# Patient Record
Sex: Male | Born: 2000 | Hispanic: No | Marital: Single | Smoking: Never smoker
Health system: Southern US, Community
[De-identification: ages and names within clinical notes are randomized; demographics above are authoritative.]

## PROBLEM LIST (undated history)

## (undated) DIAGNOSIS — E785 Hyperlipidemia, unspecified: Secondary | ICD-10-CM

## (undated) DIAGNOSIS — R079 Chest pain, unspecified: Secondary | ICD-10-CM

## (undated) DIAGNOSIS — B079 Viral wart, unspecified: Secondary | ICD-10-CM

## (undated) DIAGNOSIS — N529 Male erectile dysfunction, unspecified: Secondary | ICD-10-CM

## (undated) DIAGNOSIS — E782 Mixed hyperlipidemia: Secondary | ICD-10-CM

## (undated) DIAGNOSIS — K5900 Constipation, unspecified: Secondary | ICD-10-CM

## (undated) HISTORY — DX: Viral wart, unspecified: B07.9

## (undated) HISTORY — PX: NO PAST SURGERIES: SHX2092

## (undated) HISTORY — DX: Chest pain, unspecified: R07.9

## (undated) HISTORY — DX: Constipation, unspecified: K59.00

## (undated) HISTORY — DX: Mixed hyperlipidemia: E78.2

## (undated) HISTORY — DX: Hyperlipidemia, unspecified: E78.5

## (undated) HISTORY — DX: Male erectile dysfunction, unspecified: N52.9

---
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
## 2021-02-17 DIAGNOSIS — K5900 Constipation, unspecified: Secondary | ICD-10-CM | POA: Insufficient documentation

## 2021-02-17 DIAGNOSIS — N529 Male erectile dysfunction, unspecified: Secondary | ICD-10-CM | POA: Insufficient documentation

## 2021-02-17 DIAGNOSIS — E785 Hyperlipidemia, unspecified: Secondary | ICD-10-CM | POA: Insufficient documentation

## 2021-02-17 DIAGNOSIS — R079 Chest pain, unspecified: Secondary | ICD-10-CM | POA: Insufficient documentation

## 2021-02-17 DIAGNOSIS — B079 Viral wart, unspecified: Secondary | ICD-10-CM | POA: Insufficient documentation

## 2021-02-17 DIAGNOSIS — E782 Mixed hyperlipidemia: Secondary | ICD-10-CM | POA: Insufficient documentation

## 2021-02-22 ENCOUNTER — Encounter: Payer: Self-pay | Admitting: Cardiology

## 2021-02-22 ENCOUNTER — Ambulatory Visit: Payer: 59 | Admitting: Cardiology

## 2021-02-22 ENCOUNTER — Other Ambulatory Visit: Payer: Self-pay

## 2021-02-22 VITALS — BP 112/66 | HR 65 | Ht 74.0 in | Wt 246.0 lb

## 2021-02-22 DIAGNOSIS — N528 Other male erectile dysfunction: Secondary | ICD-10-CM

## 2021-02-22 DIAGNOSIS — E782 Mixed hyperlipidemia: Secondary | ICD-10-CM

## 2021-02-22 DIAGNOSIS — R079 Chest pain, unspecified: Secondary | ICD-10-CM | POA: Diagnosis not present

## 2021-02-22 NOTE — Patient Instructions (Signed)
Medication Instructions:  No medication changes. *If you need a refill on your cardiac medications before your next appointment, please call your pharmacy*   Lab Work: None ordered If you have labs (blood work) drawn today and your tests are completely normal, you will receive your results only by: MyChart Message (if you have MyChart) OR A paper copy in the mail If you have any lab test that is abnormal or we need to change your treatment, we will call you to review the results.   Testing/Procedures:  We will order CT coronary calcium score. It will cost $99.00 and is not covered by insurance.  Please call [phone] to schedule.   [facility]  [address]  [place], [place] [address]       Stress Echocardiogram Information Sheet                                                      Instructions:    1. You may take your morning medications the morning of the test  2. Light breakfast no caffeine  3. Dress prepared to exercise.  4. DO NOT use ANY caffeine or tobacco products 3 hours before appointment.  5. Please bring all current prescription medications.  Follow-Up: At [facility], you and your health needs are our priority.  As part of our continuing mission to provide you with exceptional heart care, we have created designated Provider Care Teams.  These Care Teams include your primary Cardiologist (physician) and Advanced Practice Providers (APPs -  Physician Assistants and Nurse Practitioners) who all work together to provide you with the care you need, when you need it.  We recommend signing up for the patient portal called "MyChart".  Sign up information is provided on this After Visit Summary.  MyChart is used to connect with patients for Virtual Visits (Telemedicine).  Patients are able to view lab/test results, encounter notes, upcoming appointments, etc.  Non-urgent messages can be sent to your provider as well.   To learn more about what you can  do with MyChart, go to [email or web address].    Your next appointment:   3 month(s)  The format for your next appointment:   In Person  Provider:   Belva Crome, MD   Other Instructions  Coronary Calcium Scan A coronary calcium scan is an imaging test used to look for deposits of plaque in the inner lining of the blood vessels of the heart (coronary arteries). Plaque is made up of calcium, protein, and fatty substances. These deposits of plaque can partly clog and narrow the coronary arteries without producing any symptoms or warning signs. This puts a person at risk for a heart attack. This test is recommended for people who are at moderate risk for heart disease. The test can find plaque deposits before symptoms develop. Tell a health care provider about: Any allergies you have. All medicines you are taking, including vitamins, herbs, eye drops, creams, and over-the-counter medicines. Any problems you or family members have had with anesthetic medicines. Any blood disorders you have. Any surgeries you have had. Any medical conditions you have. Whether you are pregnant or may be pregnant. What are the risks? Generally, this is a safe procedure. However, problems may occur, including: Harm to a pregnant woman and her unborn baby. This test involves the  use of radiation. Radiation exposure can be dangerous to a pregnant woman and her unborn baby. If you are pregnant or think you may be pregnant, you should not have this procedure done. Slight increase in the risk of cancer. This is because of the radiation involved in the test. What happens before the procedure? Ask your health care provider for any specific instructions on how to prepare for this procedure. You may be asked to avoid products that contain caffeine, tobacco, or nicotine for 4 hours before the procedure. What happens during the procedure? You will undress and remove any jewelry from your neck or chest. You will put  on a hospital gown. Sticky electrodes will be placed on your chest. The electrodes will be connected to an electrocardiogram (ECG) machine to record a tracing of the electrical activity of your heart. You will lie down on a curved bed that is attached to the CT scanner. You may be given medicine to slow down your heart rate so that clear pictures can be created. You will be moved into the CT scanner, and the CT scanner will take pictures of your heart. During this time, you will be asked to lie still and hold your breath for 2-3 seconds at a time while each picture of your heart is being taken. The procedure may vary among health care providers and hospitals.    What happens after the procedure? You can get dressed. You can return to your normal activities. It is up to you to get the results of your procedure. Ask your health care provider, or the department that is doing the procedure, when your results will be ready. Summary A coronary calcium scan is an imaging test used to look for deposits of plaque in the inner lining of the blood vessels of the heart (coronary arteries). Plaque is made up of calcium, protein, and fatty substances. Generally, this is a safe procedure. Tell your health care provider if you are pregnant or may be pregnant. Ask your health care provider for any specific instructions on how to prepare for this procedure. A CT scanner will take pictures of your heart. You can return to your normal activities after the scan is done. This information is not intended to replace advice given to you by your health care provider. Make sure you discuss any questions you have with your health care provider. Document Revised: 10/16/2018 Document Reviewed: 10/16/2018 Elsevier Patient Education  2021 Elsevier Inc.   Exercise Stress Echocardiogram An exercise stress echocardiogram is a test to check how well your heart is working. This test uses sound waves and a computer to make  pictures of your heart. These pictures will be taken before and after you exercise. For this test, you will walk on a treadmill or ride a bicycle to make your heart beat faster. While you exercise, your heart will be checked with an electrocardiogram (ECG). Your blood pressure will also be checked. You may have this test if: You have chest pain or a heart problem. You had a heart attack or heart surgery not long ago. You have heart valve problems. You have a condition that causes narrowing of the blood vessels that supply your heart. You have a high risk of heart disease and: You are starting a new exercise program. You need to have a big surgery. Tell a doctor about: Any allergies you have. All medicines you are taking. This includes vitamins, herbs, eye drops, creams, and over-the-counter medicines. Any problems you or family  members have had with medicines that make you fall asleep (anesthetic medicines). Any surgeries you have had. Any blood disorders you have. Any medical conditions you have. Whether you are pregnant or may be pregnant. What are the risks? Generally, this is a safe test. However, problems may occur, including: Chest pain. Feeling dizzy or light-headed. Shortness of breath. Increased or irregular heartbeat. Feeling like you may vomit (nausea) or vomiting. Heart attack. This is very rare. What happens before the test? Medicines Ask your doctor about changing or stopping your normal medicines. This is important if you take diabetes medicines or blood thinners. If you use an inhaler, bring it to the test. General instructions Wear comfortable clothes and walking shoes. Follow instructions from your doctor about what you cannot eat or drink before the test. Do not drink or eat anything that has caffeine in it. Stop having caffeine 24 hours before the test. Do not smoke or use products that contain nicotine or tobacco for 4 hours before the test. If you need help  quitting, ask your doctor. What happens during the test?  You will take off your clothes from the waist up and put on a hospital gown. Electrodes or patches will be put on your chest. A blood pressure cuff will be put on your arm. Before you exercise, a computer will make a picture of your heart. To do this: You will lie down and a gel will be put on your chest. A wand will be moved over the gel. Sound waves from the wand will go to the computer to make the picture. Then, you will start to exercise. You may walk on a treadmill or pedal a bicycle. Your blood pressure and heart rhythm will be checked while you exercise. The exercise will get harder or faster. You will exercise until: Your heart reaches a certain level. You are too tired to go on. You cannot go on because of chest pain, weakness, or dizziness. You will lie down right away so another picture of your heart can be taken. The procedure may vary among doctors and hospitals. What can I expect after the test? After your test, it is common to have: Mild soreness. Mild tiredness. Your heart rate and blood pressure will be checked until they return to your normal levels. You should not have any new symptoms after this test. Follow these instructions at home: If your doctor says that you can, you may: Eat what you normally eat. Do your normal activities. Take over-the-counter and prescription medicines only as told by your doctor. Keep all follow-up visits. It is up to you to get the results of your test. Ask how to get your results when they are ready. Contact a doctor if: You feel dizzy or light-headed. You have a fast or irregular heartbeat. You feel like you may vomit or you vomit. You have a headache. You feel short of breath. Get help right away if: You develop pain or pressure: In your chest. In your jaw or neck. Between your shoulders. That goes down your left arm. You faint. You have trouble breathing. These  symptoms may be an emergency. Get medical help right away. Call your local emergency services (911 in the [place] Do not wait to see if the symptoms will go away. Do not drive yourself to the hospital. Summary This is a test that checks how well your heart is working. Follow instructions about what you cannot eat or drink before the test. Ask your doctor if you  should take your normal medicines before the test. Stop having caffeine 24 hours before the test. Do not smoke or use products with nicotine or tobacco in them for 4 hours before the test. During the test, your blood pressure and heart rhythm will be checked while you exercise. This information is not intended to replace advice given to you by your health care provider. Make sure you discuss any questions you have with your health care provider. Document Revised: 12/09/2020 Document Reviewed: 11/19/2019 Elsevier Patient Education  2022 [employer].

## 2021-02-22 NOTE — Progress Notes (Signed)
Cardiology Office Note:    Date:  02/22/2021   ID:  Louis Tucker, DOB 2001-02-11, MRN [id number]  PCP:  Eunice Blase, PA-C  Cardiologist:  Garwin Brothers, MD   Referring MD: Eunice Blase, PA-C    ASSESSMENT:    1. Mixed hyperlipidemia   2. Chest pain, unspecified type   3. Other male erectile dysfunction    PLAN:    In order of problems listed above:  Primary prevention stressed with the patient.  Importance of compliance with diet medication stressed any vocalized understanding.  He was advised to walk at least half an hour every day or 2 slow jogs and he is agreeable.  I told him to do this after be done with the stress test. Chest pain: Atypical in etiology.  He wants to evaluated as he is concerned about it.  We will do an exercise stress echo.  If it is negative he was told to embark on an exercise program and he agrees. Mixed dyslipidemia: Appears to be familial in nature and I discussed that with him and he would be best referred to a dietitian with such degree of elevated lipids and he is agreeable. Coronary risk stratification: We will set him up for a calcium score.  This is in view of his risk factors and his chest pain.  He is agreeable. Erectile dysfunction: As mentioned below from an accident during sexual intercourse.  I told him the value of intervention such as regular aerobic exercise and he agrees.  I told him to also seek the help of urologist or any other specialist from referral institutions or tertiary institutions like [facility] or [facility] and he is going to think about it. Patient will be seen in follow-up appointment in 6 months or earlier if the patient has any concerns    Medication Adjustments/Labs and Tests Ordered: Current medicines are reviewed at length with the patient today.  Concerns regarding medicines are outlined above.  Orders Placed This Encounter  Procedures   CT CARDIAC SCORING   Amb ref to Medical Nutrition Therapy-MNT   EKG 12-Lead    ECHOCARDIOGRAM STRESS TEST    No orders of the defined types were placed in this encounter.    History of Present Illness:    Louis Tucker is a 20 y.o. male who is being seen today for the evaluation of chest pain at the request of O'Buch, Greta, PA-C.  Patient is a pleasant 20 year old male.  He is accompanied by his mother.  He has history of erectile dysfunction.  He apparently says he had an accident during sexual intercourse and subsequently has had problems with this.  He is to use sildenafil but then had chest pain issues so he stopped taking it.  He is here for evaluation of chest pain.  No orthopnea or PND.  His chest pain is atypical in nature.  Stabbing and sensations happen at times not related clearly to exertion.  He is concerned about it.  At the time of my evaluation, the patient is alert awake oriented and in no distress.  Past Medical History:  Diagnosis Date   Chest pain    Constipation    Erectile dysfunction    Erectile dysfunction of non-organic origin 05/11/2018   Hyperlipidemia    Mixed hyperlipidemia    Verruca     Past Surgical History:  Procedure Laterality Date   NO PAST SURGERIES      Current Medications: Current Meds  Medication Sig   rosuvastatin (CRESTOR)  20 MG tablet Take 20 mg by mouth daily.     Allergies:   Amoxicillin   Social History   Socioeconomic History   Marital status: Single    Spouse name: Not on file   Number of children: Not on file   Years of education: Not on file   Highest education level: Not on file  Occupational History   Not on file  Tobacco Use   Smoking status: Never   Smokeless tobacco: Never  Substance and Sexual Activity   Alcohol use: Not on file   Drug use: Not on file   Sexual activity: Not on file  Other Topics Concern   Not on file  Social History Narrative   Not on file   Social Determinants of Health   Financial Resource Strain: Not on file  Food Insecurity: Not on file   Transportation Needs: Not on file  Physical Activity: Not on file  Stress: Not on file  Social Connections: Not on file     Family History: The patient's family history includes Cancer in his maternal great-grandfather and maternal great-grandmother; Diabetes in his maternal grandfather, maternal grandmother, and maternal great-grandmother; Hypertension in his maternal grandfather, maternal grandmother, and paternal grandmother. There is no history of Heart disease.  ROS:   Please see the history of present illness.    All other systems reviewed and are negative.  EKGs/Labs/Other Studies Reviewed:    The following studies were reviewed today: EKG reveals sinus rhythm and nonspecific ST-T changes   Recent Labs: No results found for requested labs within last 8760 hours.  Recent Lipid Panel No results found for: CHOL, TRIG, HDL, CHOLHDL, VLDL, LDLCALC, LDLDIRECT  Physical Exam:    VS:  BP 112/66   Pulse 65   Ht 6\' 2"  (1.88 m)   Wt 246 lb 0.6 oz (111.6 kg)   SpO2 98%   BMI 31.59 kg/m     Wt Readings from Last 3 Encounters:  02/22/21 246 lb 0.6 oz (111.6 kg)     GEN: Patient is in no acute distress HEENT: Normal NECK: No JVD; No carotid bruits LYMPHATICS: No lymphadenopathy CARDIAC: S1 S2 regular, 2/6 systolic murmur at the apex. RESPIRATORY:  Clear to auscultation without rales, wheezing or rhonchi  ABDOMEN: Soft, non-tender, non-distended MUSCULOSKELETAL:  No edema; No deformity  SKIN: Warm and dry NEUROLOGIC:  Alert and oriented x 3 PSYCHIATRIC:  Normal affect    Signed, 02/24/21, MD  02/22/2021 9:19 AM    [facility]

## 2021-03-10 ENCOUNTER — Telehealth (HOSPITAL_COMMUNITY): Payer: Self-pay | Admitting: *Deleted

## 2021-03-10 NOTE — Telephone Encounter (Signed)
Patient given instructions for stress echo. Told to arrive by 1:30.Katrinka Blazing, Suan Halter

## 2021-03-17 ENCOUNTER — Ambulatory Visit (HOSPITAL_COMMUNITY): Payer: 59

## 2021-03-17 ENCOUNTER — Ambulatory Visit (HOSPITAL_COMMUNITY): Payer: 59 | Attending: Cardiology

## 2021-03-19 ENCOUNTER — Ambulatory Visit (INDEPENDENT_AMBULATORY_CARE_PROVIDER_SITE_OTHER)
Admission: RE | Admit: 2021-03-19 | Discharge: 2021-03-19 | Disposition: A | Discharge status: Home / Self Care | Payer: Self-pay | Source: Ambulatory Visit | Attending: Cardiology | Admitting: Cardiology

## 2021-03-19 DIAGNOSIS — E782 Mixed hyperlipidemia: Secondary | ICD-10-CM

## 2021-04-28 ENCOUNTER — Telehealth (HOSPITAL_COMMUNITY): Payer: Self-pay | Admitting: *Deleted

## 2021-04-28 ENCOUNTER — Encounter: Payer: Managed Care, Other (non HMO) | Attending: Cardiology | Admitting: Dietician

## 2021-04-28 NOTE — Telephone Encounter (Signed)
Patient given detailed instructions per Stress Test Requisition Sheet for test on 05/03/21 at 2:00.Patient Notified to arrive 30 minutes early, and that it is imperative to arrive on time for appointment to keep from having the test rescheduled.  Patient verbalized understanding. Veronia Beets

## 2021-05-03 ENCOUNTER — Ambulatory Visit (HOSPITAL_COMMUNITY): Payer: Managed Care, Other (non HMO)

## 2021-05-24 ENCOUNTER — Telehealth (HOSPITAL_COMMUNITY): Payer: Self-pay | Admitting: *Deleted

## 2021-05-24 NOTE — Telephone Encounter (Signed)
Left message on voicemail per DPR in reference to upcoming appointment scheduled on 05/31/21 at 2:00 with detailed instructions given per Myocardial Perfusion Study Information Sheet for the test. LM to arrive 15 minutes early, and that it is imperative to arrive on time for appointment to keep from having the test rescheduled. If you need to cancel or reschedule your appointment, please call the office within 24 hours of your appointment. Failure to do so may result in a cancellation of your appointment, and a $50 no show fee. Phone number given for call back for any questions.

## 2021-05-25 ENCOUNTER — Other Ambulatory Visit (HOSPITAL_COMMUNITY): Payer: Managed Care, Other (non HMO)

## 2021-05-31 ENCOUNTER — Other Ambulatory Visit: Payer: Self-pay

## 2021-05-31 ENCOUNTER — Ambulatory Visit (HOSPITAL_COMMUNITY): Payer: Managed Care, Other (non HMO)

## 2021-05-31 ENCOUNTER — Ambulatory Visit (HOSPITAL_COMMUNITY): Payer: Managed Care, Other (non HMO) | Attending: Cardiology

## 2021-05-31 DIAGNOSIS — R079 Chest pain, unspecified: Secondary | ICD-10-CM | POA: Diagnosis not present

## 2021-05-31 LAB — ECHOCARDIOGRAM STRESS TEST
Area-P 1/2: 3.56 cm2
S' Lateral: 3 cm

## 2021-05-31 MED ORDER — PERFLUTREN LIPID MICROSPHERE
1.0000 mL | INTRAVENOUS | Status: AC | PRN
  Administered 2021-05-31 (×2): 2 mL via INTRAVENOUS
  Administered 2021-05-31: 1 mL via INTRAVENOUS
  Administered 2021-05-31: 2 mL via INTRAVENOUS

## 2021-06-01 ENCOUNTER — Telehealth: Payer: Self-pay

## 2021-06-01 NOTE — Telephone Encounter (Signed)
-----   Message from Garwin Brothers, MD sent at 06/01/2021 12:24 PM EST ----- The results of the study is unremarkable. Please inform patient. I will discuss in detail at next appointment. Cc  primary care/referring physician Garwin Brothers, MD 06/01/2021 12:24 PM

## 2021-06-01 NOTE — Telephone Encounter (Signed)
Left VM to call back 

## 2021-06-11 ENCOUNTER — Ambulatory Visit: Payer: 59 | Admitting: Cardiology

## 2021-08-02 ENCOUNTER — Other Ambulatory Visit: Payer: Self-pay

## 2021-08-03 ENCOUNTER — Ambulatory Visit: Payer: 59 | Admitting: Cardiology

## 2021-09-28 ENCOUNTER — Ambulatory Visit: Payer: 59 | Admitting: Cardiology

## 2022-02-01 ENCOUNTER — Telehealth: Payer: Self-pay | Admitting: Cardiology

## 2022-02-01 NOTE — Telephone Encounter (Signed)
Patient needs letter for [employer] stating that his heart is fine, and his test came back fine.

## 2022-02-01 NOTE — Telephone Encounter (Signed)
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 Please advise upon your return

## 2022-02-07 NOTE — Telephone Encounter (Signed)
Pt aware that not is ready and he request to come to the office to pick up.

## 2023-03-10 IMAGING — CT CT CARDIAC CORONARY ARTERY CALCIUM SCORE
3 series · 14 of 20 positions shown, 16 images · non-contrast
Comparison: None.
COMPARISON: None.

Addendum:
EXAM:
OVER-READ INTERPRETATION  CT CHEST

The following report is an over-read performed by radiologist Dr.
Manrrique Bolom [REDACTED] on 03/19/2021. This
over-read does not include interpretation of cardiac or coronary
anatomy or pathology. The coronary calcium score interpretation by
the cardiologist is attached.
CLINICAL DATA: Cardiovascular Disease Risk stratification
Coronary Calcium Score
TECHNIQUE: A gated, non-contrast computed tomography scan of the heart was
performed using 3mm slice thickness. Axial images were analyzed on a
dedicated workstation. Calcium scoring of the coronary arteries was
performed using the Agatston method.

[Series 2: cascseq 2.0 sa36 70% (id) · axial · 0.44mm/px · z∈[-272,-182]mm · 4 of 76 slices shown]
[im 16/76  vessel]
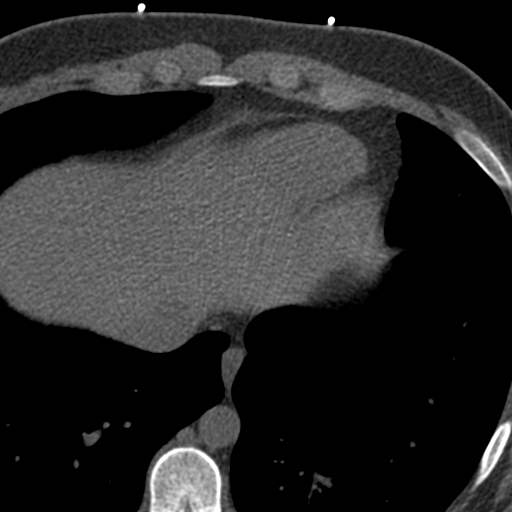
[im 31/76  vessel]
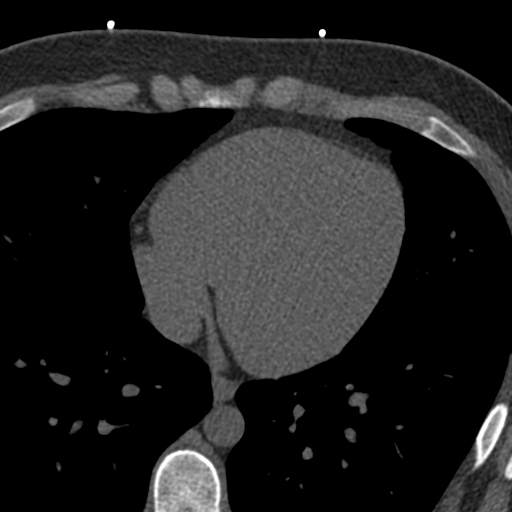
[im 46/76  vessel]
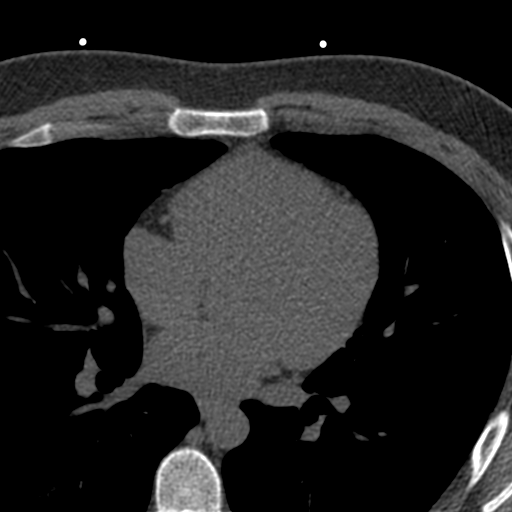
[im 61/76  vessel]
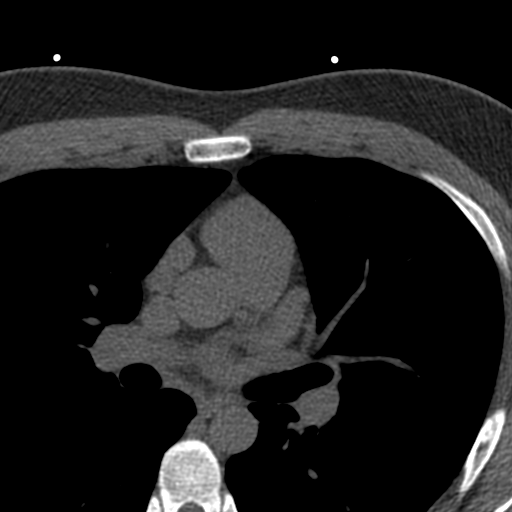

[Series 3: cascseq 2.0 bf37 st · axial · 0.76mm/px · z∈[-278,-178]mm · 5 of 76 slices shown, 7 images]
[im 13/76  vessel]
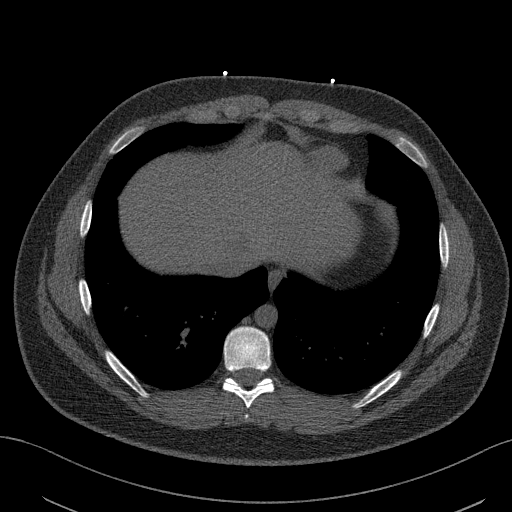
[im 13/76  lung]
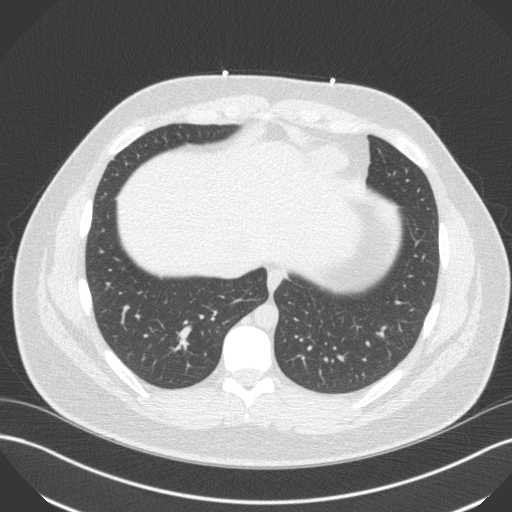
[im 26/76  vessel]
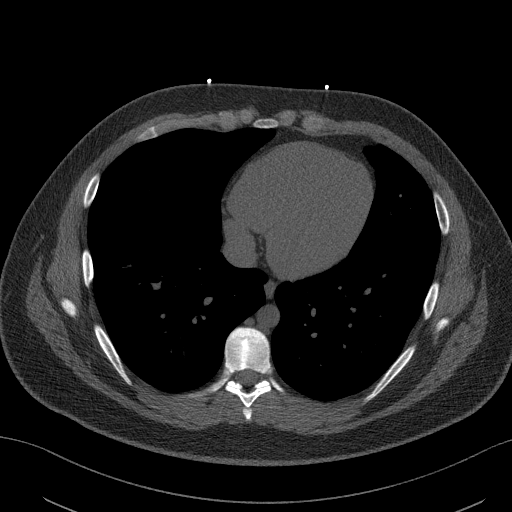
[im 38/76  vessel]
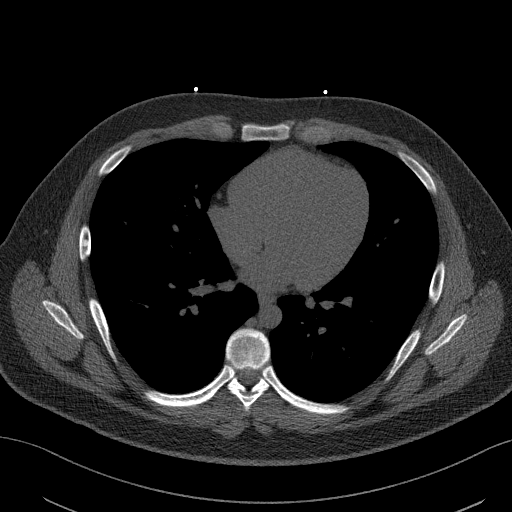
[im 51/76  vessel]
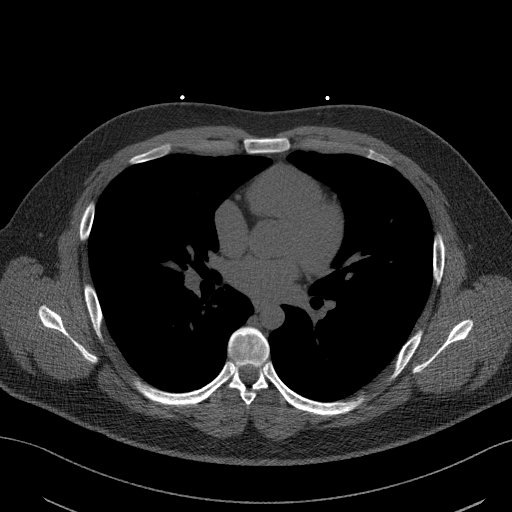
[im 63/76  vessel]
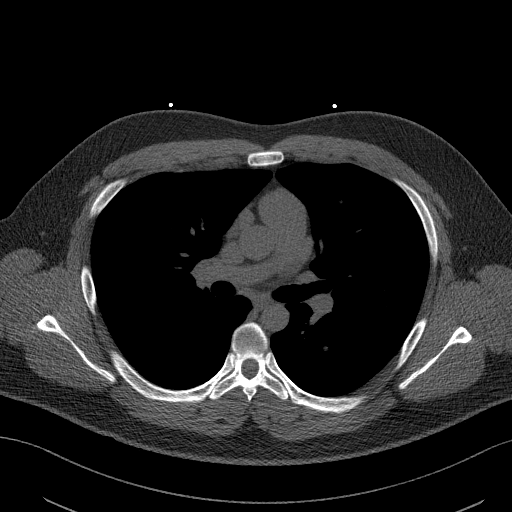
[im 63/76  lung]
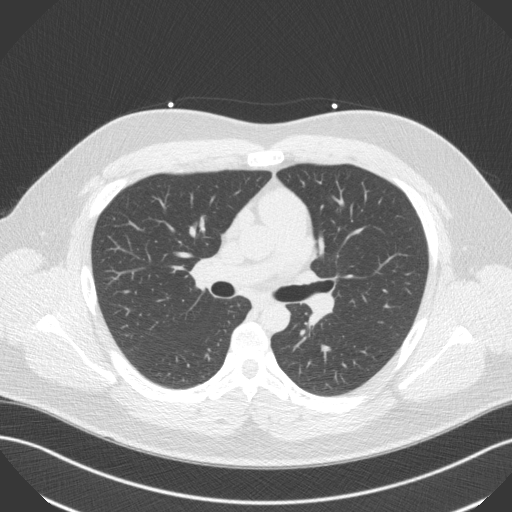

[Series 4: cascseq 2.0 br59 lung · axial · 0.76mm/px · z∈[-278,-178]mm · 5 of 76 slices shown]
[im 13/76  lung]
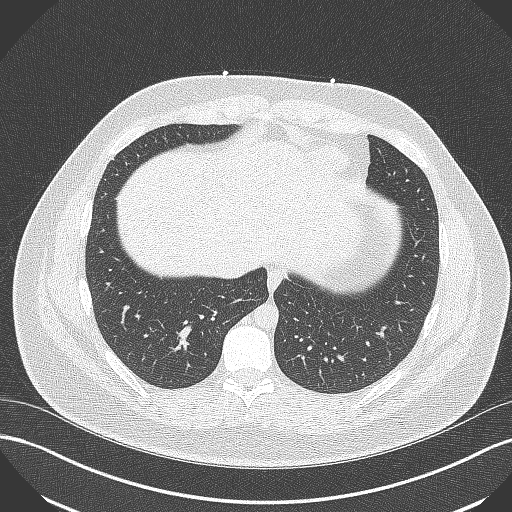
[im 26/76  lung]
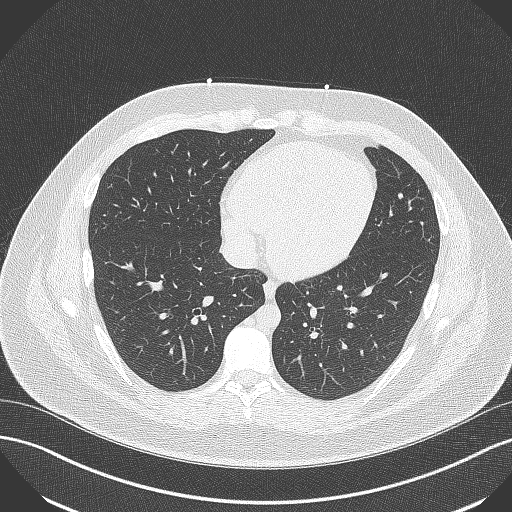
[im 38/76  lung]
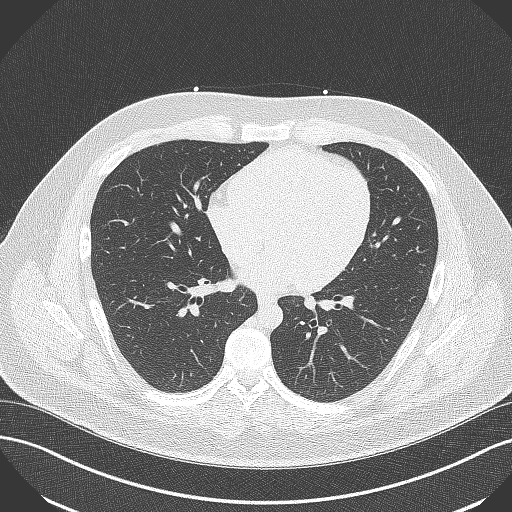
[im 51/76  lung]
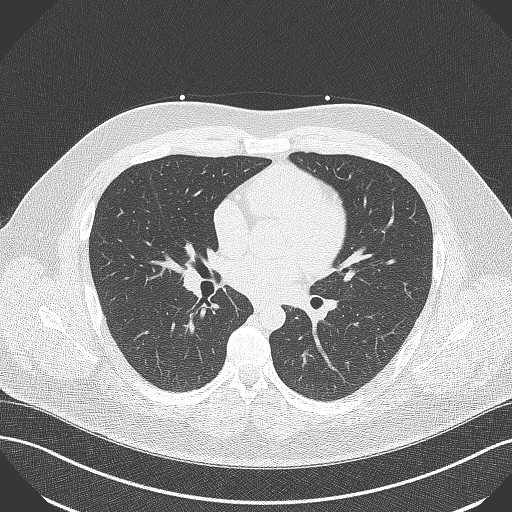
[im 63/76  lung]
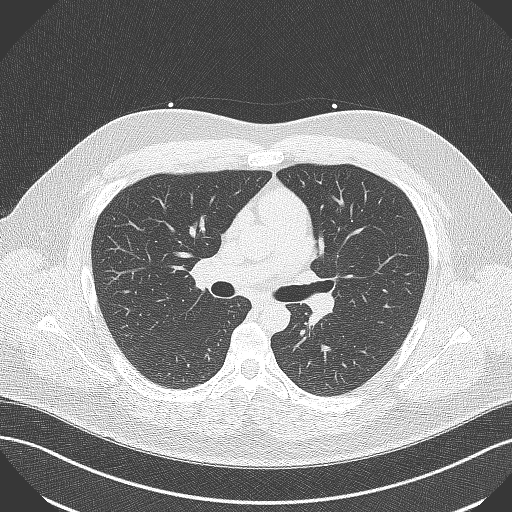

[14 of 20 positions shown; findings below may reference images not displayed]

FINDINGS: Within the visualized portions of the thorax there are no suspicious
appearing pulmonary nodules or masses, there is no acute
consolidative airspace disease, no pleural effusions, no
pneumothorax and no lymphadenopathy. Visualized portions of the
upper abdomen are unremarkable. There are no aggressive appearing
lytic or blastic lesions noted in the visualized portions of the
skeleton.
IMPRESSION: 1. No significant incidental noncardiac findings are noted.
FINDINGS: Coronary Calcium Score:

Left main: 0

Left anterior descending artery: 0

Left circumflex artery: 0

Right coronary artery: 0

Total: 0

Percentile: 0

Pericardium: Normal.

Ascending Aorta: Normal caliber.

Non-cardiac: See separate report from [REDACTED].
IMPRESSION: Coronary calcium score of 0. This was 0 percentile for age-, race-,
and sex-matched controls.



If CAC=0, it is reasonable to withhold statin therapy and reassess
in 5 to 10 years, as long as higher risk conditions are absent
(diabetes mellitus, family history of premature CHD in first degree
relatives (males <55 years; females <65 years), cigarette smoking,
or LDL >=190 mg/dL).

If CAC is 1 to 99, it is reasonable to initiate statin therapy for
patients >=55 years of age.

If CAC is >=100 or >=75th percentile, it is reasonable to initiate
statin therapy at any age.

Cardiology referral should be considered for patients with CAC
scores >=400 or >=75th percentile.

*4167 AHA/ACC/AACVPR/AAPA/ABC/BISSELL/RENE EDGAR/CHAI/Behan/CHAIRES/WILHITE/SUI
Guideline on the Management of Blood Cholesterol: A Report of the
American College of Cardiology/American Heart Association Task Force
on Clinical Practice Guidelines. J Am Coll Cardiol.
8839;73(24):5753-5733.

*** End of Addendum ***
EXAM:
OVER-READ INTERPRETATION  CT CHEST

The following report is an over-read performed by radiologist Dr.
Manrrique Bolom [REDACTED] on 03/19/2021. This
over-read does not include interpretation of cardiac or coronary
anatomy or pathology. The coronary calcium score interpretation by
the cardiologist is attached.
FINDINGS: Within the visualized portions of the thorax there are no suspicious
appearing pulmonary nodules or masses, there is no acute
consolidative airspace disease, no pleural effusions, no
pneumothorax and no lymphadenopathy. Visualized portions of the
upper abdomen are unremarkable. There are no aggressive appearing
lytic or blastic lesions noted in the visualized portions of the
skeleton.
IMPRESSION: 1. No significant incidental noncardiac findings are noted.

## 2023-03-14 ENCOUNTER — Encounter: Payer: Self-pay | Admitting: Family Medicine

## 2023-04-14 ENCOUNTER — Other Ambulatory Visit: Payer: Self-pay | Admitting: Internal Medicine

## 2023-04-14 DIAGNOSIS — R1012 Left upper quadrant pain: Secondary | ICD-10-CM

## 2023-05-04 ENCOUNTER — Ambulatory Visit
Admission: RE | Admit: 2023-05-04 | Discharge: 2023-05-04 | Disposition: A | Discharge status: Home / Self Care | Payer: Commercial Managed Care - HMO | Source: Ambulatory Visit | Attending: Internal Medicine | Admitting: Internal Medicine

## 2023-05-04 DIAGNOSIS — R1012 Left upper quadrant pain: Secondary | ICD-10-CM

## 2023-05-04 MED ORDER — IOPAMIDOL (ISOVUE-300) INJECTION 61%
200.0000 mL | Freq: Once | INTRAVENOUS | Status: AC | PRN
  Administered 2023-05-04: 100 mL via INTRAVENOUS

## 2024-04-17 ENCOUNTER — Encounter: Payer: Self-pay | Admitting: Family Medicine

## 2024-04-17 ENCOUNTER — Ambulatory Visit (INDEPENDENT_AMBULATORY_CARE_PROVIDER_SITE_OTHER): Payer: Self-pay | Admitting: Family Medicine

## 2024-04-17 VITALS — Resp 18 | Ht 72.0 in | Wt 216.7 lb

## 2024-04-17 DIAGNOSIS — R197 Diarrhea, unspecified: Secondary | ICD-10-CM | POA: Diagnosis not present

## 2024-04-17 DIAGNOSIS — Z23 Encounter for immunization: Secondary | ICD-10-CM

## 2024-04-17 DIAGNOSIS — R82998 Other abnormal findings in urine: Secondary | ICD-10-CM | POA: Diagnosis not present

## 2024-04-17 DIAGNOSIS — E782 Mixed hyperlipidemia: Secondary | ICD-10-CM

## 2024-04-17 DIAGNOSIS — R1011 Right upper quadrant pain: Secondary | ICD-10-CM | POA: Diagnosis not present

## 2024-04-17 DIAGNOSIS — R195 Other fecal abnormalities: Secondary | ICD-10-CM | POA: Diagnosis not present

## 2024-04-17 DIAGNOSIS — R1010 Upper abdominal pain, unspecified: Secondary | ICD-10-CM | POA: Insufficient documentation

## 2024-04-17 DIAGNOSIS — K219 Gastro-esophageal reflux disease without esophagitis: Secondary | ICD-10-CM | POA: Diagnosis not present

## 2024-04-17 LAB — POCT URINALYSIS DIP (CLINITEK)
Bilirubin, UA: NEGATIVE
Blood, UA: NEGATIVE
Glucose, UA: NEGATIVE mg/dL
Ketones, POC UA: NEGATIVE mg/dL
Leukocytes, UA: NEGATIVE
Nitrite, UA: NEGATIVE
POC PROTEIN,UA: NEGATIVE
Spec Grav, UA: 1.02
Urobilinogen, UA: 0.2 U/dL
pH, UA: 6

## 2024-04-17 MED ORDER — OMEPRAZOLE 20 MG PO CPDR: 20.0000 mg | DELAYED_RELEASE_CAPSULE | Freq: Every day | ORAL | 3 refills | Status: AC

## 2024-04-17 NOTE — Assessment & Plan Note (Addendum)
 Gastroesophageal reflux disease Intermittent GERD with recent exacerbation. Symptoms not relieved by OTC Nexium. Possible dietary triggers identified. - Recommended papaya enzyme. - Advised dietary modifications: avoid late meals, red meat. Orders:   omeprazole  (PRILOSEC) 20 MG capsule; Take 1 capsule (20 mg total) by mouth daily.

## 2024-04-17 NOTE — Assessment & Plan Note (Addendum)
 Chronic pale colored stool that have been going on for several weeks. Concerned about what could be causing this. Differentials include infection or liver damage. - Labs drawn Orders:   Comprehensive metabolic panel with GFR

## 2024-04-17 NOTE — Progress Notes (Signed)
 "  Subjective:  Patient ID: Louis Tucker, male    DOB: 15-Oct-2000  Age: 24 y.o. MRN: [id number]  Chief Complaint  Patient presents with   Abdominal Pain   Urinary Frequency     Discussed the use of AI scribe software for clinical note transcription with the patient, who gave verbal consent to proceed.  History of Present Illness   Louis Tucker is a 24 year old male who presents with abdominal pain, bloating, and changes in stool and urine color.  Abdominal pain and bloating - Right upper quadrant abdominal pain, persistent with certain movements - Pain improved with probiotics but not fully resolved - Bloating present - Onset of symptoms after running and holding urine for an extended period - No recent abdominal trauma or injury  Altered bowel habits and stool color - Pale-colored stool, described as 'really yellow' - Past episode of clay-colored stool - History of rectal bleeding and diarrhea - Rarely has solid stool - No constipation  Urinary changes - Urine described as 'a little bit dark' - No recent urinary tract infections  Chest pain and gastroesophageal symptoms - Stabbing chest pain, suspected to be acid reflux - No relief with Nexium - No current nausea, but past episodes of nausea without vomiting  Dietary factors and medication use - Eats red meat, which sometimes worsens symptoms - Takes probiotics regularly - Occasional use of over-the-counter reflux medication as needed - No other regular medications  Prior diagnostic evaluation - CT scan and x-ray showed no gallstones or abnormalities in liver, pancreas, spleen, adrenals, kidneys, or appendix - No recent liver or kidney function tests; last labs in January 2025 - No EKG performed - Cardiac etiology ruled out by cardiologist       04/17/2024    1:52 PM  Depression screen PHQ 2/9  Decreased Interest 0  Down, Depressed, Hopeless 0  PHQ - 2 Score 0  Altered sleeping 0  Tired,  decreased energy 1  Change in appetite 1  Feeling bad or failure about yourself  0  Trouble concentrating 0  Moving slowly or fidgety/restless 0  Suicidal thoughts 0  PHQ-9 Score 2  Difficult doing work/chores Somewhat difficult         04/17/2024    1:52 PM  Fall Risk  Falls in the past year? 0  Was there an injury with Fall? 0  Fall Risk Category Calculator 0  Patient at Risk for Falls Due to No Fall Risks  Fall risk Follow up Falls evaluation completed     Medications Ordered Prior to Encounter[1]. Social History   Socioeconomic History   Marital status: Single    Spouse name: Not on file   Number of children: Not on file   Years of education: Not on file   Highest education level: Not on file  Occupational History   Not on file  Tobacco Use   Smoking status: Never   Smokeless tobacco: Never  Vaping Use   Vaping status: Never Used  Substance and Sexual Activity   Alcohol use: Never   Drug use: Never   Sexual activity: Yes  Other Topics Concern   Not on file  Social History Narrative   Not on file   Social Drivers of Health   Tobacco Use: Low Risk (04/17/2024)   Patient History    Smoking Tobacco Use: Never    Smokeless Tobacco Use: Never    Passive Exposure: Not on file  Financial Resource Strain: Not on file  Food  Insecurity: Not on file  Transportation Needs: Not on file  Physical Activity: Not on file  Stress: Not on file  Social Connections: Not on file  Depression (PHQ2-9): Low Risk (04/17/2024)   Depression (PHQ2-9)    PHQ-2 Score: 2  Alcohol Screen: Not on file  Housing: Not on file  Utilities: Not on file  Health Literacy: Not on file   Past Medical History:  Diagnosis Date   Chest pain    Constipation    Erectile dysfunction    Erectile dysfunction of non-organic origin 05/11/2018   Hyperlipidemia    Mixed hyperlipidemia    Verruca    Family History  Problem Relation Age of Onset   Hyperlipidemia Mother    Hyperlipidemia Father     Hyperlipidemia Brother    Hypertension Maternal Grandmother    Diabetes Maternal Grandmother    Diabetes Maternal Grandfather    Hypertension Maternal Grandfather    Hypertension Paternal Grandmother    Cancer Maternal Great-grandfather    Diabetes Maternal Great-grandmother    Cancer Maternal Great-grandmother    Heart disease Neg Hx     Review of Systems  Constitutional:  Negative for chills, diaphoresis, fatigue and fever.  HENT:  Negative for congestion, ear pain and sinus pain.   Respiratory:  Negative for cough and shortness of breath.   Cardiovascular:  Positive for chest pain.  Gastrointestinal:  Positive for abdominal pain (RUQ), blood in stool, diarrhea and nausea. Negative for constipation and vomiting.       Clay colored stool Reflux symptoms  Genitourinary:  Negative for dysuria.  Musculoskeletal:  Negative for arthralgias.  Neurological:  Negative for weakness and headaches.  Psychiatric/Behavioral:  Negative for dysphoric mood. The patient is not nervous/anxious.      Objective:  Resp 18   Ht 6' (1.829 m)   Wt 216 lb 11.2 oz (98.3 kg)   BMI 29.39 kg/m      04/17/2024    1:28 PM 02/22/2021    8:55 AM  BP/Weight  Systolic BP  112  Diastolic BP  66  Wt. (Lbs) 216.7 246.04  BMI 29.39 kg/m2 31.59 kg/m2    Physical Exam Vitals reviewed.  Constitutional:      General: He is not in acute distress.    Appearance: Normal appearance.  Neck:     Vascular: No carotid bruit.  Cardiovascular:     Rate and Rhythm: Normal rate and regular rhythm.     Heart sounds: Normal heart sounds.  Pulmonary:     Effort: Pulmonary effort is normal.     Breath sounds: Normal breath sounds.  Abdominal:     General: Bowel sounds are normal.     Palpations: Abdomen is soft.     Tenderness: There is abdominal tenderness in the right upper quadrant and left lower quadrant.     Hernia: No hernia is present.  Neurological:     Mental Status: He is alert. Mental status is at  baseline.  Psychiatric:        Mood and Affect: Mood normal.        Behavior: Behavior normal.     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 Differential includes gallbladder issues,  liver dysfunction, dietary causes, and possible alpha-gal syndrome. - Ordered CT scan of the abdomen. - Ordered stool studies. - Ordered blood tests for liver function and bilirubin. - Discussed dietary modifications: increased fiber, avoid red meat. - Consider referral to gastroenterologist. - Discussed potential alpha-gal syndrome testing if symptoms persist Orders:   CT ABDOMEN PELVIS W CONTRAST; Future   Hepatitis C antibody   Alpha-Gal Panel  Mixed hyperlipidemia Mixed hyperlipidemia Previous high cholesterol levels. - Ordered lipid panel. Orders:   Comprehensive metabolic panel with GFR   Lipid Panel   CBC with Differential  Gastroesophageal reflux disease without esophagitis Gastroesophageal reflux disease Intermittent GERD with recent exacerbation. Symptoms not relieved by OTC Nexium. Possible dietary triggers identified. - Recommended papaya enzyme. - Advised dietary modifications: avoid late meals, red meat. Orders:   omeprazole  (PRILOSEC) 20 MG capsule; Take 1 capsule (20 mg total) by mouth daily.  Acholic stool Chronic pale colored stool that have been going on for several weeks. Concerned about what could be causing this. Differentials include infection or liver damage. - Labs drawn Orders:   Comprehensive metabolic panel with GFR  Diarrhea, unspecified type Acute on chronic Evaluated  previously and was told by specialist to eat more fiber. CT negative/Cardiac workup negative. - order further testing for evaluation Orders:   Alpha-Gal Panel   Cdiff NAA+O+P+Stool Culture  Dark urine Acute Experiencing recent dark urine with several associated symptoms of bloating, abdominal pain, diarrhea and pale colored stools - UA normal Lab Results  Component Value Date   COLORU straw (A) 04/17/2024   CLARITYU cloudy (A) 04/17/2024   GLUCOSEUR negative 04/17/2024   BILIRUBINUR negative 04/17/2024   SPECGRAV 1.020 04/17/2024   RBCUR negative 04/17/2024   PHUR 6.0 04/17/2024   UROBILINOGEN 0.2 04/17/2024   LEUKOCYTESUR Negative 04/17/2024    Orders:   POCT URINALYSIS DIP (CLINITEK)  Encounter for immunization General health maintenance Discussion of flu vaccination and general health maintenance. - Administered flu vaccine. - Discussed COVID-19 vaccination options.  Orders:   Flu vaccine trivalent PF, 6mos and older(Flulaval,Afluria,Fluarix,Fluzone)    Follow-up: Return in about 3 months (around 07/16/2024) for chronic.  AVS was given to patient prior to departure.  Harrie Cedar, FNP [facility] [phone]        [1]  No current outpatient medications on file prior to visit.   No current facility-administered medications on file prior to visit.   "

## 2024-04-17 NOTE — Assessment & Plan Note (Addendum)
 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 Differential includes gallbladder issues, liver dysfunction, dietary causes, and possible alpha-gal syndrome. - Ordered CT scan of the abdomen. - Ordered stool studies. - Ordered blood tests for liver function and bilirubin. - Discussed dietary modifications: increased fiber, avoid red meat. - Consider referral to gastroenterologist. - Discussed potential alpha-gal syndrome testing if symptoms persist Orders:   CT ABDOMEN PELVIS W CONTRAST; Future   Hepatitis C antibody   Alpha-Gal Panel

## 2024-04-17 NOTE — Assessment & Plan Note (Deleted)
  Orders:   POCT URINALYSIS DIP (CLINITEK)

## 2024-04-17 NOTE — Assessment & Plan Note (Addendum)
 Mixed hyperlipidemia Previous high cholesterol levels. - Ordered lipid panel. Orders:   Comprehensive metabolic panel with GFR   Lipid Panel   CBC with Differential

## 2024-04-18 DIAGNOSIS — R197 Diarrhea, unspecified: Secondary | ICD-10-CM | POA: Insufficient documentation

## 2024-04-18 DIAGNOSIS — Z23 Encounter for immunization: Secondary | ICD-10-CM | POA: Insufficient documentation

## 2024-04-18 NOTE — Assessment & Plan Note (Addendum)
 Acute on chronic Evaluated previously and was told by specialist to eat more fiber. CT negative/Cardiac workup negative. - order further testing for evaluation Orders:   Alpha-Gal Panel   Cdiff NAA+O+P+Stool Culture

## 2024-04-18 NOTE — Assessment & Plan Note (Addendum)
 General health maintenance Discussion of flu vaccination and general health maintenance. - Administered flu vaccine. - Discussed COVID-19 vaccination options.  Orders:   Flu vaccine trivalent PF, 6mos and older(Flulaval,Afluria,Fluarix,Fluzone)

## 2024-04-19 LAB — CBC WITH DIFFERENTIAL/PLATELET
Basophils Absolute: 0.1 x10E3/uL (ref 0.0–0.2)
Basos: 1 %
EOS (ABSOLUTE): 0.2 x10E3/uL (ref 0.0–0.4)
Eos: 3 %
Hematocrit: 48.7 % (ref 37.5–51.0)
Hemoglobin: 15.7 g/dL (ref 13.0–17.7)
Immature Grans (Abs): 0 x10E3/uL (ref 0.0–0.1)
Immature Granulocytes: 0 %
Lymphocytes Absolute: 1.9 x10E3/uL (ref 0.7–3.1)
Lymphs: 36 %
MCH: 27.5 pg (ref 26.6–33.0)
MCHC: 32.2 g/dL (ref 31.5–35.7)
MCV: 85 fL (ref 79–97)
Monocytes Absolute: 0.5 x10E3/uL (ref 0.1–0.9)
Monocytes: 9 %
Neutrophils Absolute: 2.5 x10E3/uL (ref 1.4–7.0)
Neutrophils: 51 %
Platelets: 268 x10E3/uL (ref 150–450)
RBC: 5.71 x10E6/uL (ref 4.14–5.80)
RDW: 13.9 % (ref 11.6–15.4)
WBC: 5.1 x10E3/uL (ref 3.4–10.8)

## 2024-04-19 LAB — LIPID PANEL
Chol/HDL Ratio: 4.8 ratio (ref 0.0–5.0)
Cholesterol, Total: 249 mg/dL — ABNORMAL HIGH (ref 100–199)
HDL: 52 mg/dL
LDL Chol Calc (NIH): 173 mg/dL — ABNORMAL HIGH (ref 0–99)
Triglycerides: 132 mg/dL (ref 0–149)
VLDL Cholesterol Cal: 24 mg/dL (ref 5–40)

## 2024-04-19 LAB — ALPHA-GAL PANEL
Allergen Lamb IgE: <0.1 kU/L
Beef IgE: <0.1 kU/L
IgE (Immunoglobulin E), Serum: 51 [IU]/mL (ref 6–495)
O215-IgE Alpha-Gal: <0.1 kU/L
Pork IgE: <0.1 kU/L

## 2024-04-19 LAB — COMPREHENSIVE METABOLIC PANEL WITH GFR
ALT: 18 IU/L (ref 0–44)
AST: 16 IU/L (ref 0–40)
Albumin: 5 g/dL (ref 4.3–5.2)
Alkaline Phosphatase: 82 IU/L (ref 47–123)
BUN/Creatinine Ratio: 16 (ref 9–20)
BUN: 17 mg/dL (ref 6–20)
Bilirubin Total: 0.3 mg/dL (ref 0.0–1.2)
CO2: 23 mmol/L (ref 20–29)
Calcium: 10.2 mg/dL (ref 8.7–10.2)
Chloride: 101 mmol/L (ref 96–106)
Creatinine, Ser: 1.07 mg/dL (ref 0.76–1.27)
Globulin, Total: 2.7 g/dL (ref 1.5–4.5)
Glucose: 84 mg/dL (ref 70–99)
Potassium: 4.8 mmol/L (ref 3.5–5.2)
Sodium: 139 mmol/L (ref 134–144)
Total Protein: 7.7 g/dL (ref 6.0–8.5)
eGFR: 99 mL/min/1.73

## 2024-04-19 LAB — HEPATITIS C ANTIBODY: Hep C Virus Ab: NONREACTIVE

## 2024-04-25 ENCOUNTER — Ambulatory Visit: Payer: Self-pay | Admitting: Family Medicine

## 2024-04-30 ENCOUNTER — Ambulatory Visit (HOSPITAL_BASED_OUTPATIENT_CLINIC_OR_DEPARTMENT_OTHER)
Admission: RE | Admit: 2024-04-30 | Discharge: 2024-04-30 | Disposition: A | Source: Ambulatory Visit | Attending: Family Medicine | Admitting: Family Medicine

## 2024-04-30 DIAGNOSIS — R1011 Right upper quadrant pain: Secondary | ICD-10-CM

## 2024-04-30 MED ORDER — IOHEXOL 300 MG/ML  SOLN
100.0000 mL | Freq: Once | INTRAMUSCULAR | Status: AC | PRN
  Administered 2024-04-30: 100 mL via INTRAVENOUS

## 2024-07-17 ENCOUNTER — Ambulatory Visit: Admitting: Family Medicine
